# Patient Record
Sex: Female | Born: 1950 | Race: White | Hispanic: No | Marital: Single | Smoking: Never smoker
Health system: Southern US, Community
[De-identification: ages and names within clinical notes are randomized; demographics above are authoritative.]

---

## 2017-10-12 ENCOUNTER — Emergency Department (INDEPENDENT_AMBULATORY_CARE_PROVIDER_SITE_OTHER): Payer: Medicare HMO

## 2017-10-12 ENCOUNTER — Encounter: Payer: Self-pay | Admitting: Emergency Medicine

## 2017-10-12 ENCOUNTER — Emergency Department (INDEPENDENT_AMBULATORY_CARE_PROVIDER_SITE_OTHER)
Admission: EM | Admit: 2017-10-12 | Discharge: 2017-10-12 | Disposition: A | Discharge status: Home / Self Care | Payer: Medicare HMO | Source: Home / Self Care | Attending: Emergency Medicine | Admitting: Emergency Medicine

## 2017-10-12 DIAGNOSIS — R911 Solitary pulmonary nodule: Secondary | ICD-10-CM

## 2017-10-12 DIAGNOSIS — R059 Cough, unspecified: Secondary | ICD-10-CM

## 2017-10-12 DIAGNOSIS — R918 Other nonspecific abnormal finding of lung field: Secondary | ICD-10-CM | POA: Diagnosis not present

## 2017-10-12 DIAGNOSIS — R062 Wheezing: Secondary | ICD-10-CM | POA: Diagnosis not present

## 2017-10-12 DIAGNOSIS — R05 Cough: Secondary | ICD-10-CM | POA: Diagnosis not present

## 2017-10-12 DIAGNOSIS — R0989 Other specified symptoms and signs involving the circulatory and respiratory systems: Secondary | ICD-10-CM

## 2017-10-12 LAB — POCT CBC W AUTO DIFF (K'VILLE URGENT CARE)

## 2017-10-12 MED ORDER — ALBUTEROL SULFATE HFA 108 (90 BASE) MCG/ACT IN AERS: 1.0000 | INHALATION_SPRAY | Freq: Four times a day (QID) | RESPIRATORY_TRACT | 0 refills | Status: AC | PRN

## 2017-10-12 MED ORDER — IPRATROPIUM-ALBUTEROL 0.5-2.5 (3) MG/3ML IN SOLN
3.0000 mL | Freq: Four times a day (QID) | RESPIRATORY_TRACT | Status: DC
  Administered 2017-10-12: 3 mL via RESPIRATORY_TRACT

## 2017-10-12 MED ORDER — IPRATROPIUM-ALBUTEROL 0.5-2.5 (3) MG/3ML IN SOLN: 3.0000 mL | Freq: Once | RESPIRATORY_TRACT | Status: DC

## 2017-10-12 MED ORDER — DOXYCYCLINE HYCLATE 100 MG PO CAPS: 100.0000 mg | ORAL_CAPSULE | Freq: Two times a day (BID) | ORAL | 0 refills | Status: AC

## 2017-10-12 NOTE — Discharge Instructions (Signed)
Please make an appointment to follow-up with your primary care physician. There is a possible pulmonary nodule present on your chest x-ray which needs further evaluation. By CT Use your inhaler every 4-6 hours as needed for wheezing. Take your antibiotics twice a [place] with food. Please see your primary care physician on Friday if you are not improving. Be sure you have the CT to evaluate your abnormal chest x-ray. If you start to have more shortness of breath or cough up blood please go to the emergency room.

## 2017-10-12 NOTE — ED Triage Notes (Signed)
Pt c/o cough and wheezing x3 days. Afebrile.

## 2017-10-12 NOTE — ED Provider Notes (Addendum)
[facility]    CSN: [id number] Arrival date & time: 10/12/17  1950     History   Chief Complaint Chief Complaint  Patient presents with  . Cough    HPI Tina Kim is a 67 y.o. female.  Patient was in her usual state of health until Friday of last week which is 5 days ago when she underwent a right rotator cuff repair. This was done by Dr. Luiz BlareGraves as an outpatient. The following morning she noted chest congestion associated with wheezing and cough. She has felt tight in her chest. She has had some mild shortness of breath but she is able to go to the bathroom and around in the house without difficulty. She has had a minimal productive cough. She did take her temperature once and had a temperature of 99.9 but has had no fever today. There has been a minimal amount of phlegm. She does have head congestion with this. She has no history of asthma. She has no history of blood clots. She is a nonsmoker. HPI  History reviewed. No pertinent past medical history.  There are no active problems to display for this patient.   History reviewed. No pertinent surgical history.  OB History    No data available       Home Medications    Prior to Admission medications   Medication Sig Start Date End Date Taking? Authorizing Provider  oxyCODONE-acetaminophen (PERCOCET) 10-325 MG tablet Take 1 tablet by mouth every 4 (four) hours as needed for pain.   Yes [provider]  albuterol (PROVENTIL HFA;VENTOLIN HFA) 108 (90 Base) MCG/ACT inhaler Inhale 1-2 puffs into the lungs every 6 (six) hours as needed for wheezing or shortness of breath. 10/12/17   Collene Gobbleaub, Myeshia Fojtik A, MD  doxycycline (VIBRAMYCIN) 100 MG capsule Take 1 capsule (100 mg total) by mouth 2 (two) times daily. 10/12/17   Collene Gobbleaub, Joud Pettinato A, MD    Family History History reviewed. No pertinent family history.  Social History Social History   Tobacco Use  . Smoking status: Never Smoker  . Smokeless tobacco: Never Used    Substance Use Topics  . Alcohol use: Not on file  . Drug use: Not on file     Allergies   Patient has no allergy information on record.   Review of Systems Review of Systems  Constitutional: Positive for fever. Negative for chills and fatigue.  HENT: Positive for congestion, rhinorrhea and sore throat.   Eyes: Negative.   Respiratory: Positive for cough, chest tightness, shortness of breath and wheezing. Negative for stridor.   Cardiovascular: Negative.  Negative for chest pain, palpitations and leg swelling.  Gastrointestinal: Negative.   Musculoskeletal:       Patient is in a sling. She underwent right rotator cuff repair 5 days ago.     Physical Exam Triage Vital Signs ED Triage Vitals  Enc Vitals Group     BP      Pulse      Resp      Temp      Temp src      SpO2      Weight      Height      Head Circumference      Peak Flow      Pain Score      Pain Loc      Pain Edu?      Excl. in GC?    No data found.  Updated Vital Signs BP (!) 141/83 (  BP Location: Right Arm)   Pulse 68   Temp 98.4 F (36.9 C) (Oral)   Wt 211 lb (95.7 kg)   SpO2 96%   Visual Acuity Right Eye Distance:   Left Eye Distance:   Bilateral Distance:    Right Eye Near:   Left Eye Near:    Bilateral Near:     Physical Exam  Constitutional: She appears well-developed and well-nourished.  HENT:  Head: Normocephalic.  Mouth/Throat: Oropharynx is clear and moist.  Eyes: Pupils are equal, round, and reactive to light.  Neck: Normal range of motion.  Cardiovascular: Normal rate, regular rhythm and normal heart sounds. Exam reveals no friction rub.  No murmur heard. Pulmonary/Chest: Effort normal. No respiratory distress. She has wheezes. She has no rales. She exhibits no tenderness.  There are bilateral rhonchi with wheezes. Breath sounds are symmetrical. There are no retractions noted.  Musculoskeletal:  She is wearing a sling around her right arm.     UC Treatments / Results   Labs (all labs ordered are listed, but only abnormal results are displayed) Labs Reviewed  POCT CBC W AUTO DIFF ([facility])    EKG  EKG Interpretation None       Radiology Dg Chest 1 View  Result Date: 10/12/2017 CLINICAL DATA:  Cough, wheezing for 4 days EXAM: CHEST 1 VIEW COMPARISON:  None. FINDINGS: Nodular density projects over the left upper lobe. While this may be within the posterior left 6th rib, I cannot exclude pulmonary nodule. Right lung clear. Heart is normal size. No effusions or acute bony abnormality. IMPRESSION: Questionable left upper lobe pulmonary nodule. This could be further evaluated with noncontrast chest CT. Electronically Signed   By: Charlett Nose M.D.   On: 10/12/2017 20:34    Procedures Procedures (including critical care time)  Medications Ordered in UC Medications  ipratropium-albuterol (DUONEB) 0.5-2.5 (3) MG/3ML nebulizer solution 3 mL (3 mLs Nebulization Given 10/12/17 2045)     Initial Impression / Assessment and Plan / UC Course  I have reviewed the triage vital signs and the nursing notes.  Pertinent labs & imaging results that were available during my care of the patient were reviewed by me and considered in my medical decision making (see chart for details). Apparently the [place] after surgery patient had irritation to her larynx associated with a cough. She also had nasal congestion with wheezing. She did have a flu shot last year and has had Prevnar vaccination. She is a nonsmoker with no history of asthma. She denies any calf tenderness and there is no swelling or tenderness on exam. She apparently did have some vomiting postop raising the question of aspiration post surgery. She also could have been developing an illness prior to surgery. I did not get any red flags regarding the possibility of a blood clot. Her pulse ox was 96. She was not tachycardic. Chads score was 1.5 putting risk of PE less than 3%.  Chest x-ray returned with a  question of a left pulmonary nodule. . CBC returned with a white count of 3800, hemoglobin 13.5, and platelets 157,000. This would be suspicious the patient was developing an upper respiratory infection prior to surgery. I did not do a flu swab because she is 5 days out from developing symptoms. She was instructed to follow-up with her physician in 48 hours if not improving.     Use your inhaler every 4-6 hours as needed for wheezing. Take your antibiotics twice a [place] with food. Please  see your primary care physician on Friday if you are not improving. Be sure you have the CT to evaluate your abnormal chest x-ray. which is suspicious for a pulmonary nodule please see your primary care physician about this  If you start to have more shortness of breath or cough up blood please go to the emergency room.  Date 10/15/2017. Patient was called on Thursday, 10/13/2017 and advised to come in for a d-dimer. This was drawn at around noon. When d-dimer returned elevated patient was advised to go to the emergency room for further evaluation. She will underwent CT angiogram there. Disposition will be decided by the emergency room.  Final Clinical Impressions(s) / UC Diagnoses   Final diagnoses:  Cough  Wheezing  Solitary pulmonary nodule    ED Discharge Orders        Ordered    albuterol (PROVENTIL HFA;VENTOLIN HFA) 108 (90 Base) MCG/ACT inhaler  Every 6 hours PRN     10/12/17 2044    doxycycline (VIBRAMYCIN) 100 MG capsule  2 times daily     10/12/17 2044     Use your inhaler every 4-6 hours as needed for wheezing. Take your antibiotics twice a [place] with food. Please see your primary care physician on Friday if you are not improving. Be sure you have the CT to evaluate your abnormal chest x-ray. which is suspicious for a pulmonary nodule please see your primary care physician about this  If you start to have more shortness of breath or cough up blood please go to the emergency  room.  Controlled Substance Prescriptions  Controlled Substance Registry consulted? Not Applicable   Collene Gobble, MD 10/12/17 2059    Collene Gobble, MD 10/12/17 2103    Collene Gobble, MD 10/13/17 6295    Collene Gobble, MD 10/15/17 [phone]

## 2017-10-13 ENCOUNTER — Encounter: Payer: Self-pay | Admitting: Emergency Medicine

## 2017-10-13 ENCOUNTER — Telehealth: Payer: Self-pay | Admitting: Emergency Medicine

## 2017-10-13 ENCOUNTER — Emergency Department (INDEPENDENT_AMBULATORY_CARE_PROVIDER_SITE_OTHER)
Admission: EM | Admit: 2017-10-13 | Discharge: 2017-10-13 | Disposition: A | Discharge status: Home / Self Care | Payer: Medicare HMO | Source: Home / Self Care

## 2017-10-13 DIAGNOSIS — R059 Cough, unspecified: Secondary | ICD-10-CM

## 2017-10-13 DIAGNOSIS — R062 Wheezing: Secondary | ICD-10-CM

## 2017-10-13 DIAGNOSIS — R05 Cough: Secondary | ICD-10-CM | POA: Diagnosis not present

## 2017-10-13 LAB — D-DIMER, QUANTITATIVE (NOT AT ARMC): D DIMER QUANT: 0.84 ug{FEU}/mL — AB (ref <0.50)

## 2017-10-13 NOTE — ED Notes (Addendum)
Dr. Cleta Albertsaub instructed patient to return to have D-dimer drawn and sent off. No MD visit today. Blood draw only.   STAT pickup called in to [facility]. Confirmation # [id number]

## 2017-10-13 NOTE — Telephone Encounter (Signed)
I called and spoke with the patient. She was able to rest through the night. I was still concerned about the possibility of a clot because she had had shoulder surgery last week. She was agreeable to come by for a D dimer test. This will be done to further evaluate her symptoms. We will call her with these results. Patient's daughter is coming over in the next hour and then she will come to have the blood test done.

## 2017-10-13 NOTE — ED Provider Notes (Addendum)
[facility]    CSN: [id number] Arrival date & time: 10/13/17  1159     History   Chief Complaint Chief Complaint  Patient presents with  . Labs Only  Patient was [facility] yesterday [facility]/URI symptoms. He prescribed doxycycline for acute bronchitis.  Dr. Cleta Albertsaub also ordered d-dimer, drawn earlier today. Her main risk factor is that she is 6 days status post right rotator cuff/shoulder surgery. Her pulse was normal and pulse ox was normal at 96% yesterday when seen by Dr. Cleta Albertsaub, although she had some mild wheezing which cleared after DuoNeb treatment yesterday in urgent [facility]. Chest x-ray yesterday, shows nodular density left upper lobe which might be within the posterior left sixth rib, see chest x-ray report   Dg Chest 1 View  Result Date: 10/12/2017 CLINICAL DATA:  Cough, wheezing for 4 days EXAM: CHEST 1 VIEW COMPARISON:  None. FINDINGS: Nodular density projects over the left upper lobe. While this may be within the posterior left 6th rib, I cannot exclude pulmonary nodule. Right lung clear. Heart is normal size. No effusions or acute bony abnormality. IMPRESSION: Questionable left upper lobe pulmonary nodule. This could be further evaluated with noncontrast chest CT. Electronically Signed   By: Charlett NoseKevin  Dover M.D.   On: 10/12/2017 20:34   10/13/2017 2:39 PM    Received results of d-dimer, elevated at 0.84 (normal is less than 0.50). Although d-dimer could be affect it because she is status post surgery and can be affected by age, this is still abnormally elevated, and patient needs to go to emergency room stat for evaluation and possible VQ scan or CT angiogram of chest. I just tried to call patient at her home/cell number [phone], just got voicemail and left this message on her VM and we will continue to try to contact patient stat.  [name] M.D.  10/13/2017 3:00 PM I called patient and clearly explained the situation  and the emergent nature of [facility]. She will arrange transportation now -Lajean Manesavid Massey M.D.  10/13/2017 3:20 PM we faxed relevant notes to Dr. Liliane ShiAimee Lischke's office, fax number [phone]. I called Dr. Bettina GaviaLischke's office, [phone], spoke with Dr. Bettina GaviaLischke's nurse (Dr. Garfield CorneaLischke is out of the office this afternoon), explained the above in detail, and she will contact Dr. Garfield CorneaLischke now.  [name] M.D.  10/13/2017 3:57 PM I called patient at home. Her sister is now arriving [facility] further evaluation and treatment of ELEVATED D-DIMER and respiratory symptoms.  -Lajean Manesavid Massey M.D.  HPI Tina Kim is a 67 y.o. female.   HPI  No past medical history on file.  There are no active problems to display for this patient.   No past surgical history on file.  OB History    No data available       Home Medications    Prior to Admission medications   Medication Sig Start Date End Date Taking? Authorizing Provider  albuterol (PROVENTIL HFA;VENTOLIN HFA) 108 (90 Base) MCG/ACT inhaler Inhale 1-2 puffs into the lungs every 6 (six) hours as needed for wheezing or shortness of breath. 10/12/17   Collene Gobbleaub, Steven A, MD  doxycycline (VIBRAMYCIN) 100 MG capsule Take 1 capsule (100 mg total) by mouth 2 (two) times daily. 10/12/17   Collene Gobbleaub, Steven A, MD  oxyCODONE-acetaminophen (PERCOCET) 10-325 MG tablet Take 1 tablet by mouth every 4 (four) hours as needed for pain.  [provider]    Family History No family history on file.  Social History Social History   Tobacco Use  . Smoking status: Never Smoker  . Smokeless tobacco: Never Used  Substance Use Topics  . Alcohol use: Not on file  . Drug use: Not on file     Allergies   Patient has no allergy information on record.   Review of Systems Review of Systems   Physical Exam Triage Vital Signs ED Triage Vitals  Enc Vitals Group     BP      Pulse       Resp      Temp      Temp src      SpO2      Weight      Height      Head Circumference      Peak Flow      Pain Score      Pain Loc      Pain Edu?      Excl. in GC?    No data found.  Updated Vital Signs There were no vitals taken for this visit.  Visual Acuity Right Eye Distance:   Left Eye Distance:   Bilateral Distance:    Right Eye Near:   Left Eye Near:    Bilateral Near:     Physical Exam   UC Treatments / Results  Labs (all labs ordered are listed, but only abnormal results are displayed) Labs Reviewed  D-DIMER, QUANTITATIVE (NOT AT [facility]) - Abnormal; Notable for the following components:      Result Value   D-Dimer, Quant 0.84 (*)    All other components within normal limits    EKG  EKG Interpretation None       Radiology Dg Chest 1 View  Result Date: 10/12/2017 CLINICAL DATA:  Cough, wheezing for 4 days EXAM: CHEST 1 VIEW COMPARISON:  None. FINDINGS: Nodular density projects over the left upper lobe. While this may be within the posterior left 6th rib, I cannot exclude pulmonary nodule. Right lung clear. Heart is normal size. No effusions or acute bony abnormality. IMPRESSION: Questionable left upper lobe pulmonary nodule. This could be further evaluated with noncontrast chest CT. Electronically Signed   By: Charlett Nose M.D.   On: 10/12/2017 20:34    Procedures Procedures (including critical Kim time)  Medications Ordered in UC Medications - No data to display   Initial Impression / Assessment and Plan / UC Course  I have reviewed the triage vital signs and the nursing notes.  Pertinent labs & imaging results that were available during my Kim of the patient were reviewed by me and considered in my medical decision making (see chart for details).       Final Clinical Impressions(s) / UC Diagnoses   Final diagnoses:  Wheezing  Cough    ED Discharge Orders    None         Lajean Manes, MD 10/13/17 1503    Lajean Manes,  MD 10/13/17 1528    Lajean Manes, MD 10/13/17 1531    Lajean Manes, MD 10/13/17 [id number]

## 2017-10-13 NOTE — ED Notes (Signed)
Successful blood draw to left hand on attempt #2. Failed attempt at cephalic vein which was patient's preferred site.

## 2017-10-16 ENCOUNTER — Telehealth: Payer: Self-pay | Admitting: Emergency Medicine

## 2017-10-16 NOTE — Telephone Encounter (Signed)
Spoke with patient to see how she was doing, patient states she went to the ED as instructed by Dr Cleta Albertsaub to rule out possible blood clot.  Patient states that she went to the ED, was there for 10 hours and the only test they did was a CT scan.  Patient states that Dr Cleta Albertsaub told her there was 2 tests that he wanted he done.  Advised patient that her bloodwork was elevated for possible blood clot and that she had to go the ED for further evaluation.  If the ED docs felt like she needed more tests they would've ordered them.  Patient asked when Dr Cleta Albertsaub was working again because she would like to speak directly with him.  He will return on Thursday 10/20/17.

## 2019-07-27 IMAGING — DX DG CHEST 1V
1 series · 1 of 1 positions shown · non-contrast
Comparison: None.

CLINICAL DATA: Cough, wheezing for 4 days

EXAM:
CHEST 1 VIEW

[chest pa]
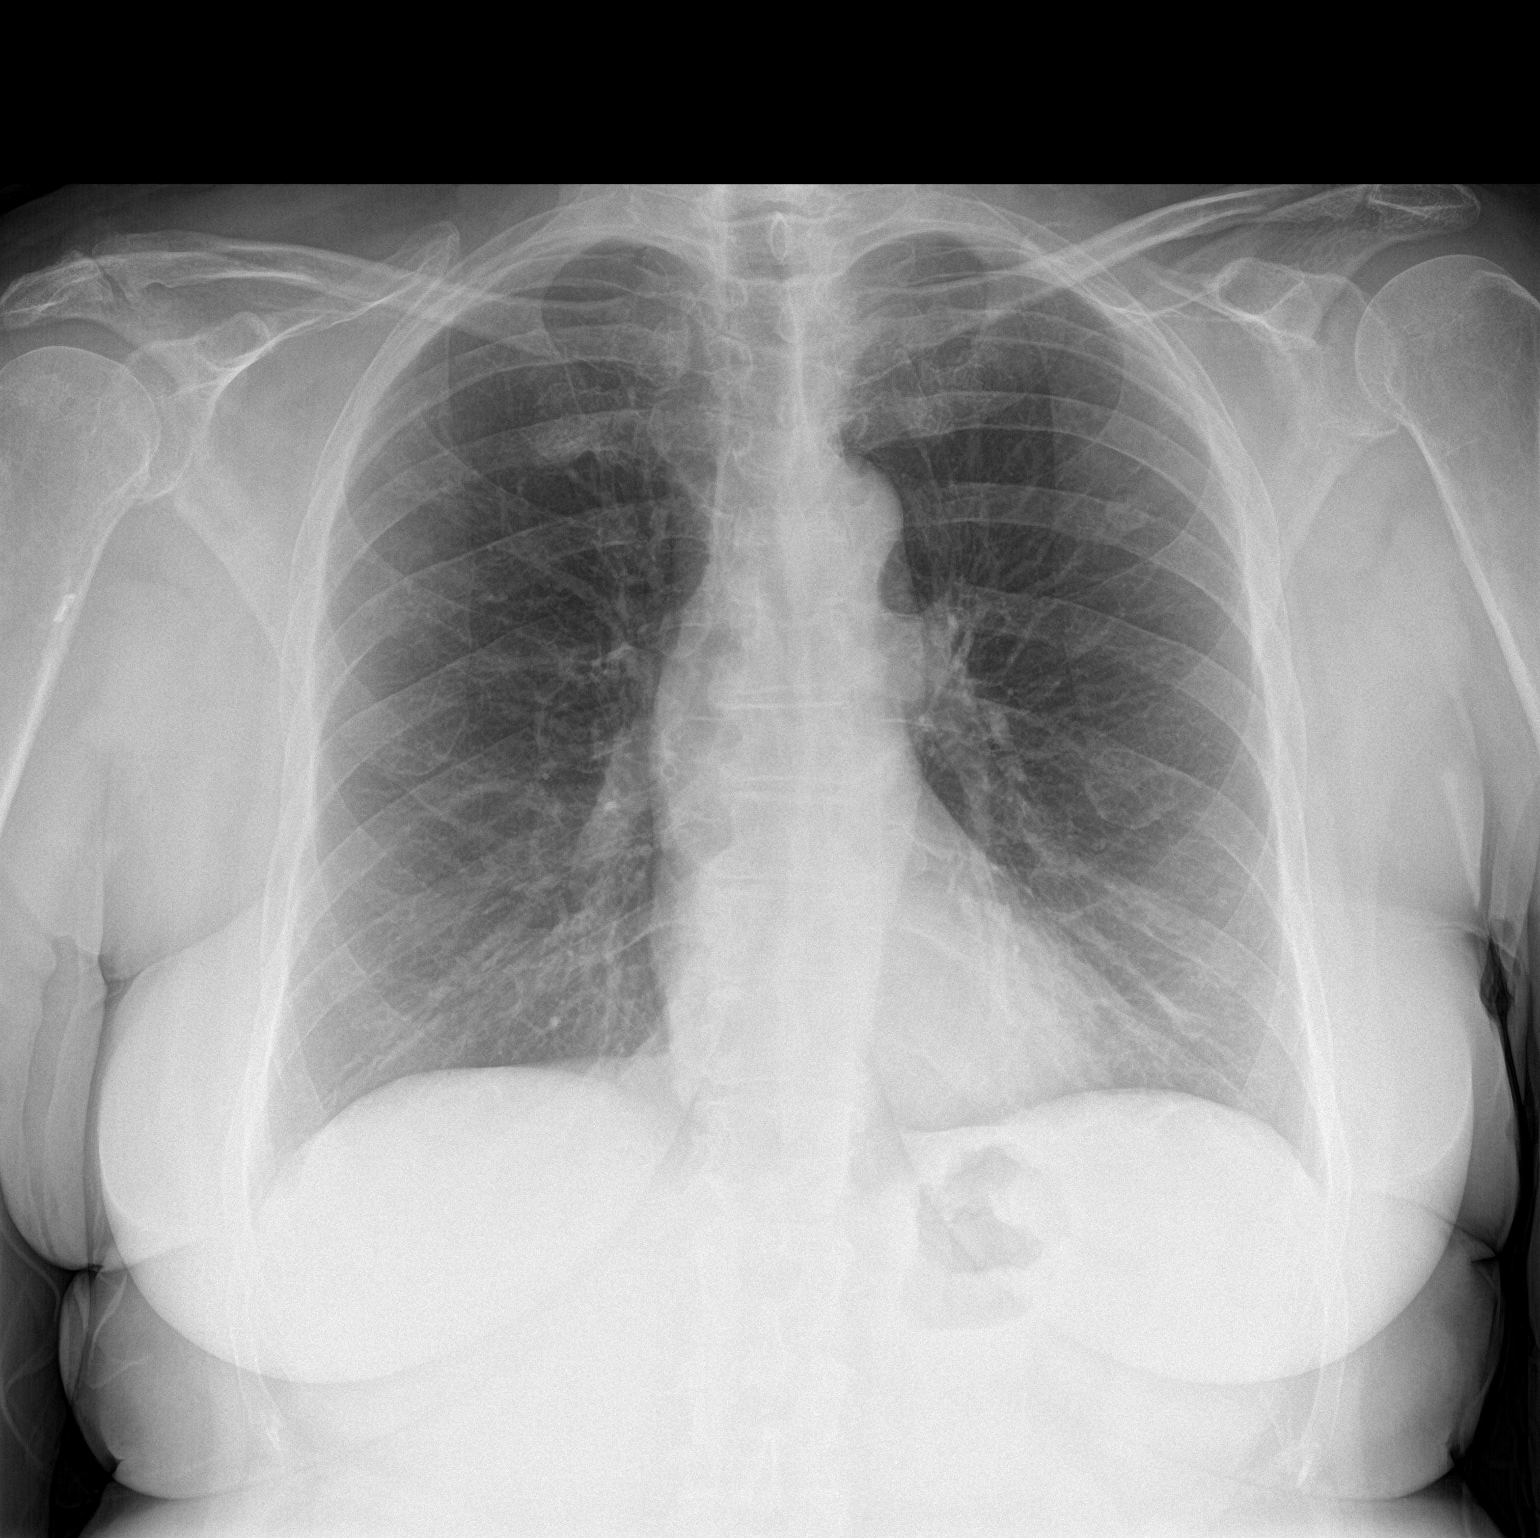

[1 of 1 positions shown; findings below may reference images not displayed]

FINDINGS: Nodular density projects over the left upper lobe. While this may be
within the posterior left 6th rib, I cannot exclude pulmonary
nodule. Right lung clear. Heart is normal size. No effusions or
acute bony abnormality.
IMPRESSION: Questionable left upper lobe pulmonary nodule. This could be further
evaluated with noncontrast chest CT.
